# Patient Record
Sex: Female | Born: 1960 | Race: White | Hispanic: No | Marital: Married
Health system: Southern US, Community
[De-identification: ages and names within clinical notes are randomized; demographics above are authoritative.]

---

## 2004-07-18 ENCOUNTER — Emergency Department: Payer: Self-pay | Admitting: Emergency Medicine

## 2004-07-19 ENCOUNTER — Ambulatory Visit: Payer: Self-pay

## 2005-03-06 ENCOUNTER — Emergency Department: Payer: Self-pay | Admitting: Emergency Medicine
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
## 2020-01-11 ENCOUNTER — Other Ambulatory Visit: Payer: Self-pay

## 2020-01-11 ENCOUNTER — Ambulatory Visit: Payer: Self-pay | Attending: Internal Medicine

## 2020-01-11 DIAGNOSIS — Z23 Encounter for immunization: Secondary | ICD-10-CM

## 2020-01-11 NOTE — Progress Notes (Signed)
   [facility]  Name:  Catherine Potter    MRN: [id number] DOB: January 15, 1961  01/11/2020  Catherine Potter was observed post Covid-19 immunization for 15 minutes without incident. She was provided with Vaccine Information Sheet and instruction to access the [employer]-Safe system.   Catherine Potter was instructed to call [phone] with any severe reactions post vaccine: Marland Kitchen Difficulty breathing  . Swelling of face and throat  . A fast heartbeat  . A bad rash all over body  . Dizziness and weakness   Immunizations Administered    No immunizations on file.
